# Patient Record
Sex: Male | Born: 1955 | Race: White | Hispanic: No | Marital: Married | Smoking: Current every day smoker
Health system: Southern US, Community
[De-identification: ages and names within clinical notes are randomized; demographics above are authoritative.]

## PROBLEM LIST (undated history)

## (undated) DIAGNOSIS — I10 Essential (primary) hypertension: Secondary | ICD-10-CM

## (undated) DIAGNOSIS — K219 Gastro-esophageal reflux disease without esophagitis: Secondary | ICD-10-CM

## (undated) DIAGNOSIS — M549 Dorsalgia, unspecified: Secondary | ICD-10-CM

## (undated) DIAGNOSIS — G8929 Other chronic pain: Secondary | ICD-10-CM

## (undated) HISTORY — PX: HERNIA REPAIR: SHX51

---

## 2006-12-24 ENCOUNTER — Other Ambulatory Visit: Payer: Self-pay

## 2006-12-24 ENCOUNTER — Inpatient Hospital Stay: Payer: Self-pay | Admitting: Internal Medicine

## 2008-06-08 IMAGING — CR CERVICAL SPINE - COMPLETE 4+ VIEW
1 series · 8 of 8 positions shown · non-contrast
Comparison: none

REASON FOR EXAM: left neck pain
COMMENTS:

[Series 1: view not recorded · 0.17mm/px · 8 of 8 slices shown]
[im 1/8]
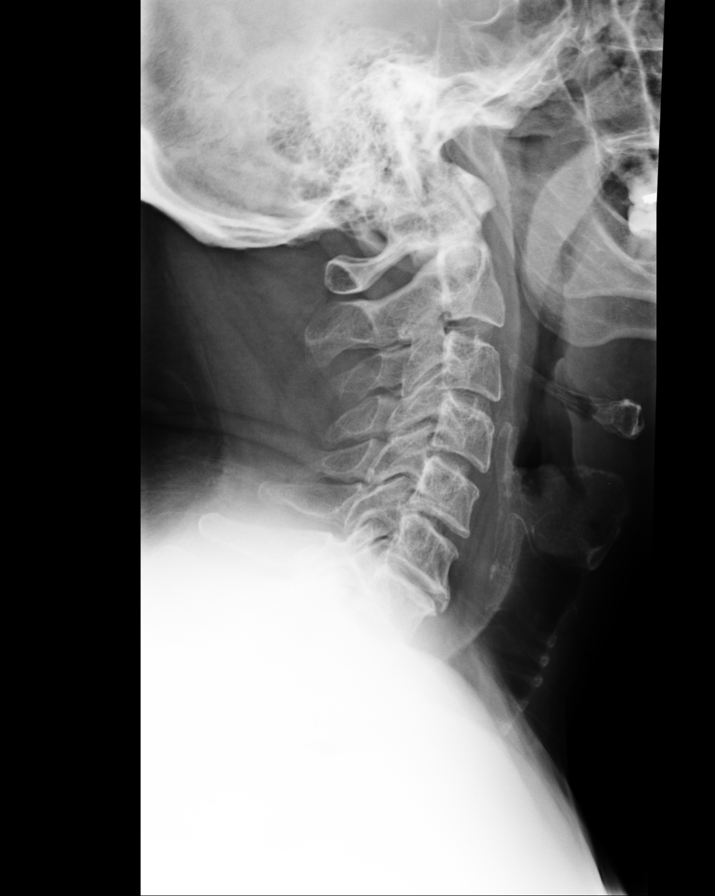
[im 2/8]
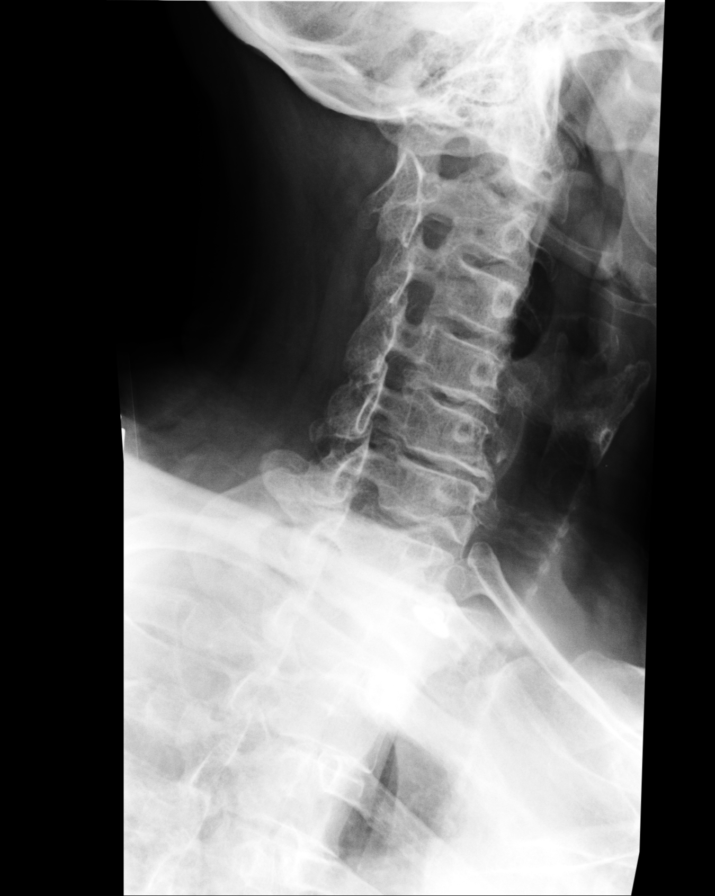
[im 3/8]
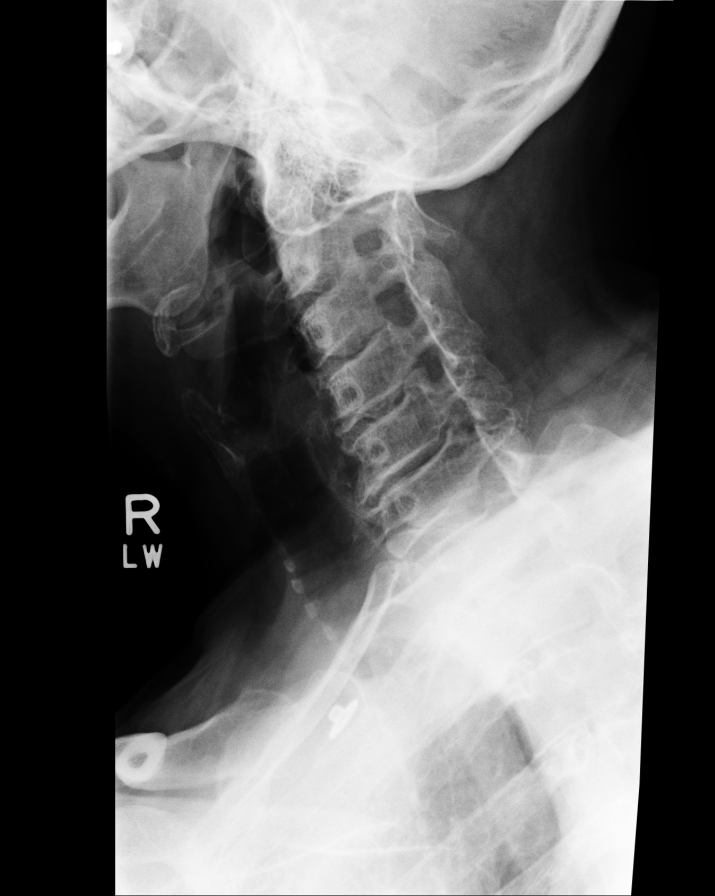
[im 4/8]
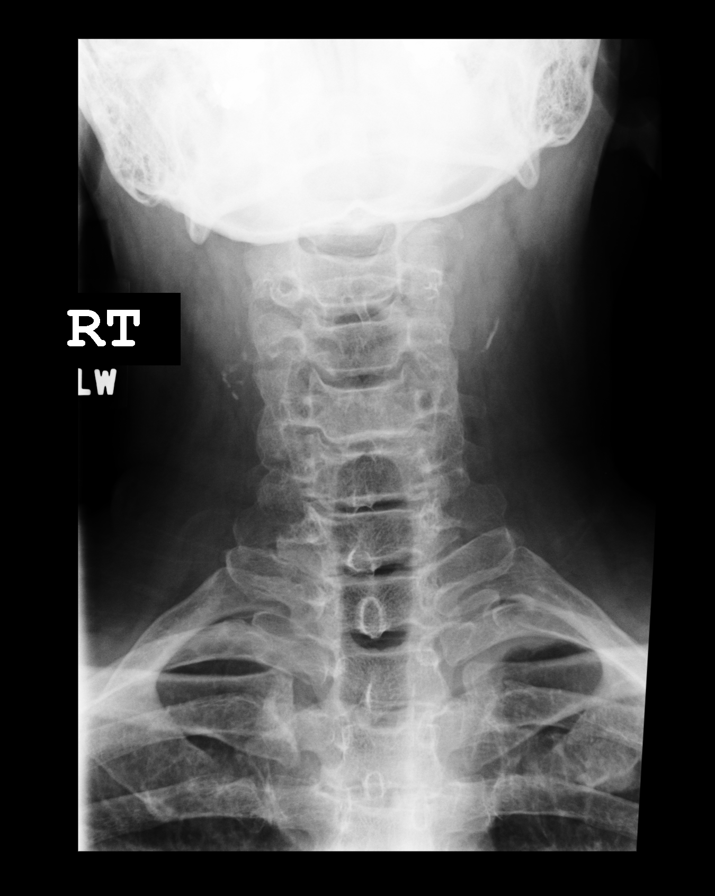
[im 5/8]
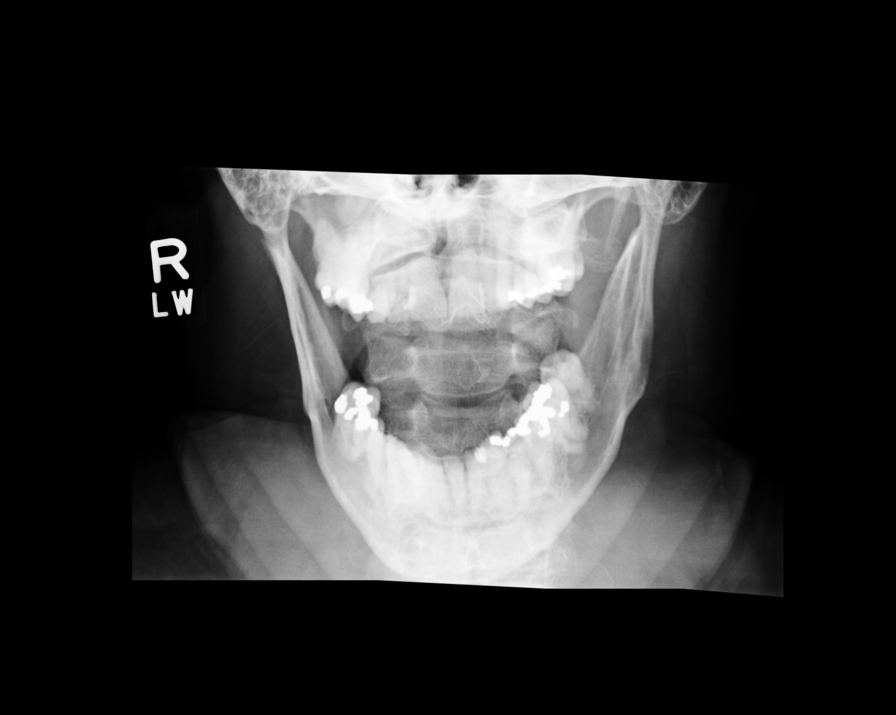
[im 6/8]
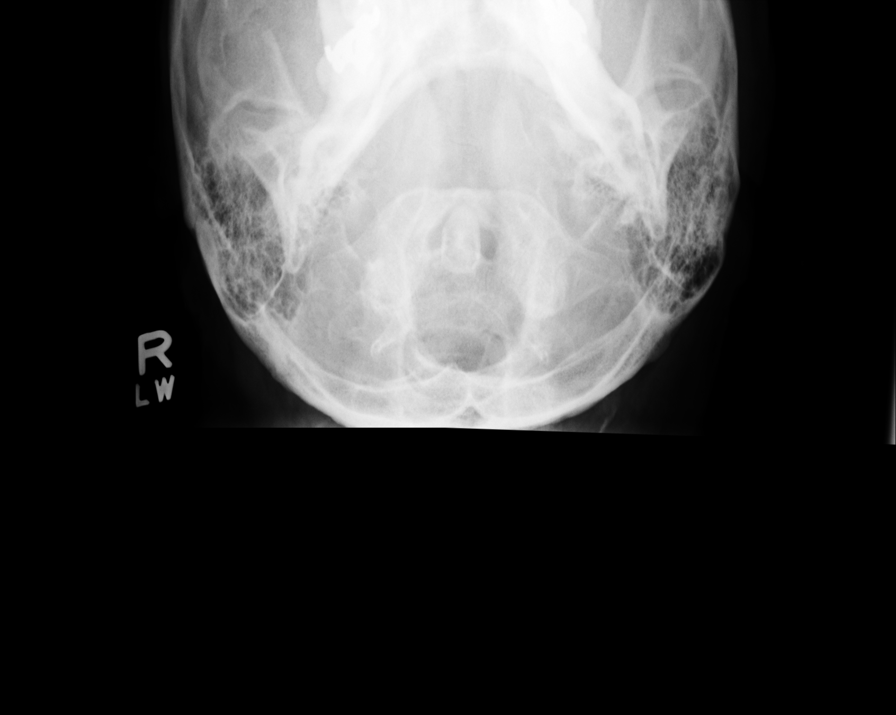
[im 7/8]
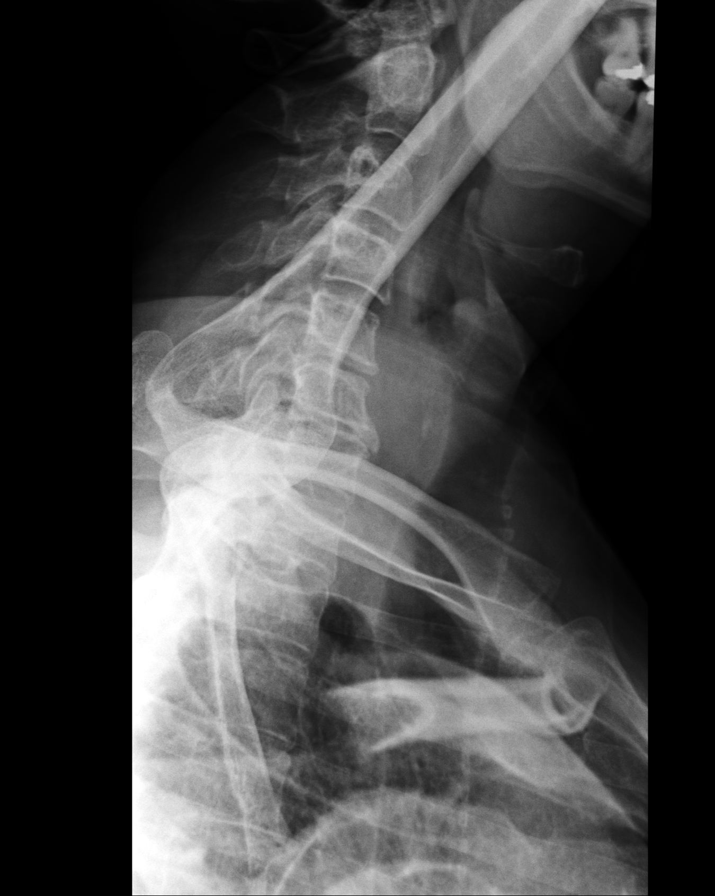
[im 8/8]
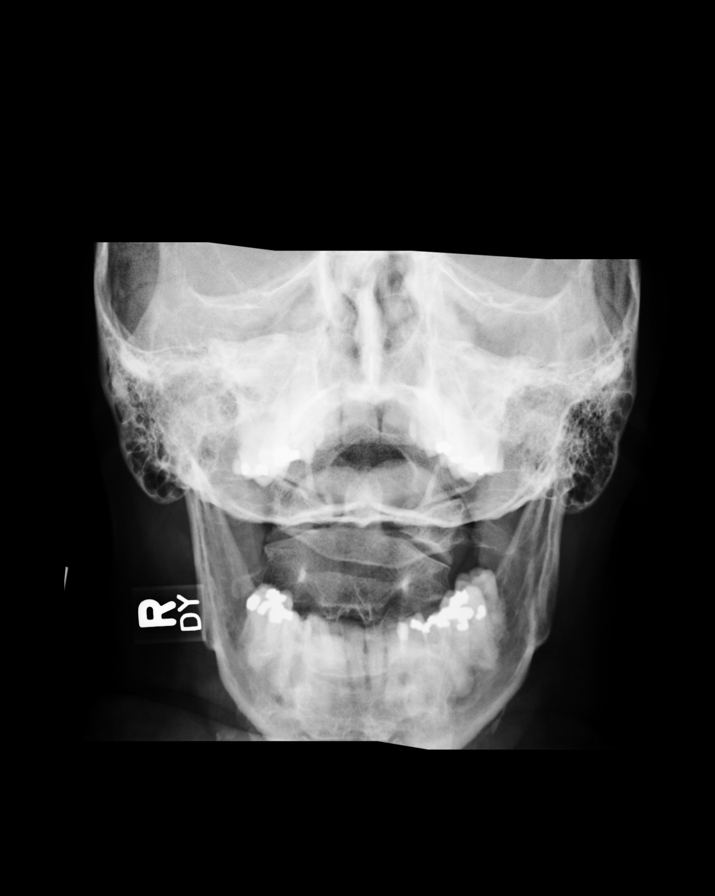

[8 of 8 positions shown; findings below may reference images not displayed]

PROCEDURE:     DXR - DXR CERVICAL SPINE COMPLETE  - December 24, 2006 [DATE]

RESULT:     Views of the cervical spine demonstrate normal alignment. There
is intervertebral disc space narrowing at multiple levels especially at
C6-C7 and C5-C6. C7-T1 is poorly seen on the lateral view. The odontoid is
not well seen. The lateral masses of C1 and C2 are poorly seen on the
open-mouth view which apparently was not repeated to good effect. The
prevertebral soft tissues appear to be normal. There is definite
atherosclerotic calcification in the carotid bifurcation regions bilaterally
worse on the RIGHT and LEFT. Correlation for bruits is recommended.
IMPRESSION: 1. Degenerative changes.
2. No acute bony abnormality evident.
3. Atherosclerotic calcification in the carotid bifurcations. Correlation
for bruit would be recommended. Doppler evaluation may be beneficial.

## 2009-01-08 ENCOUNTER — Ambulatory Visit: Payer: Self-pay

## 2010-06-24 IMAGING — CR PELVIS - 1-2 VIEW
1 series · 1 of 1 positions shown · non-contrast
Comparison: none

REASON FOR EXAM: bilateral hip pain, bone spurs
COMMENTS:

[view not recorded]
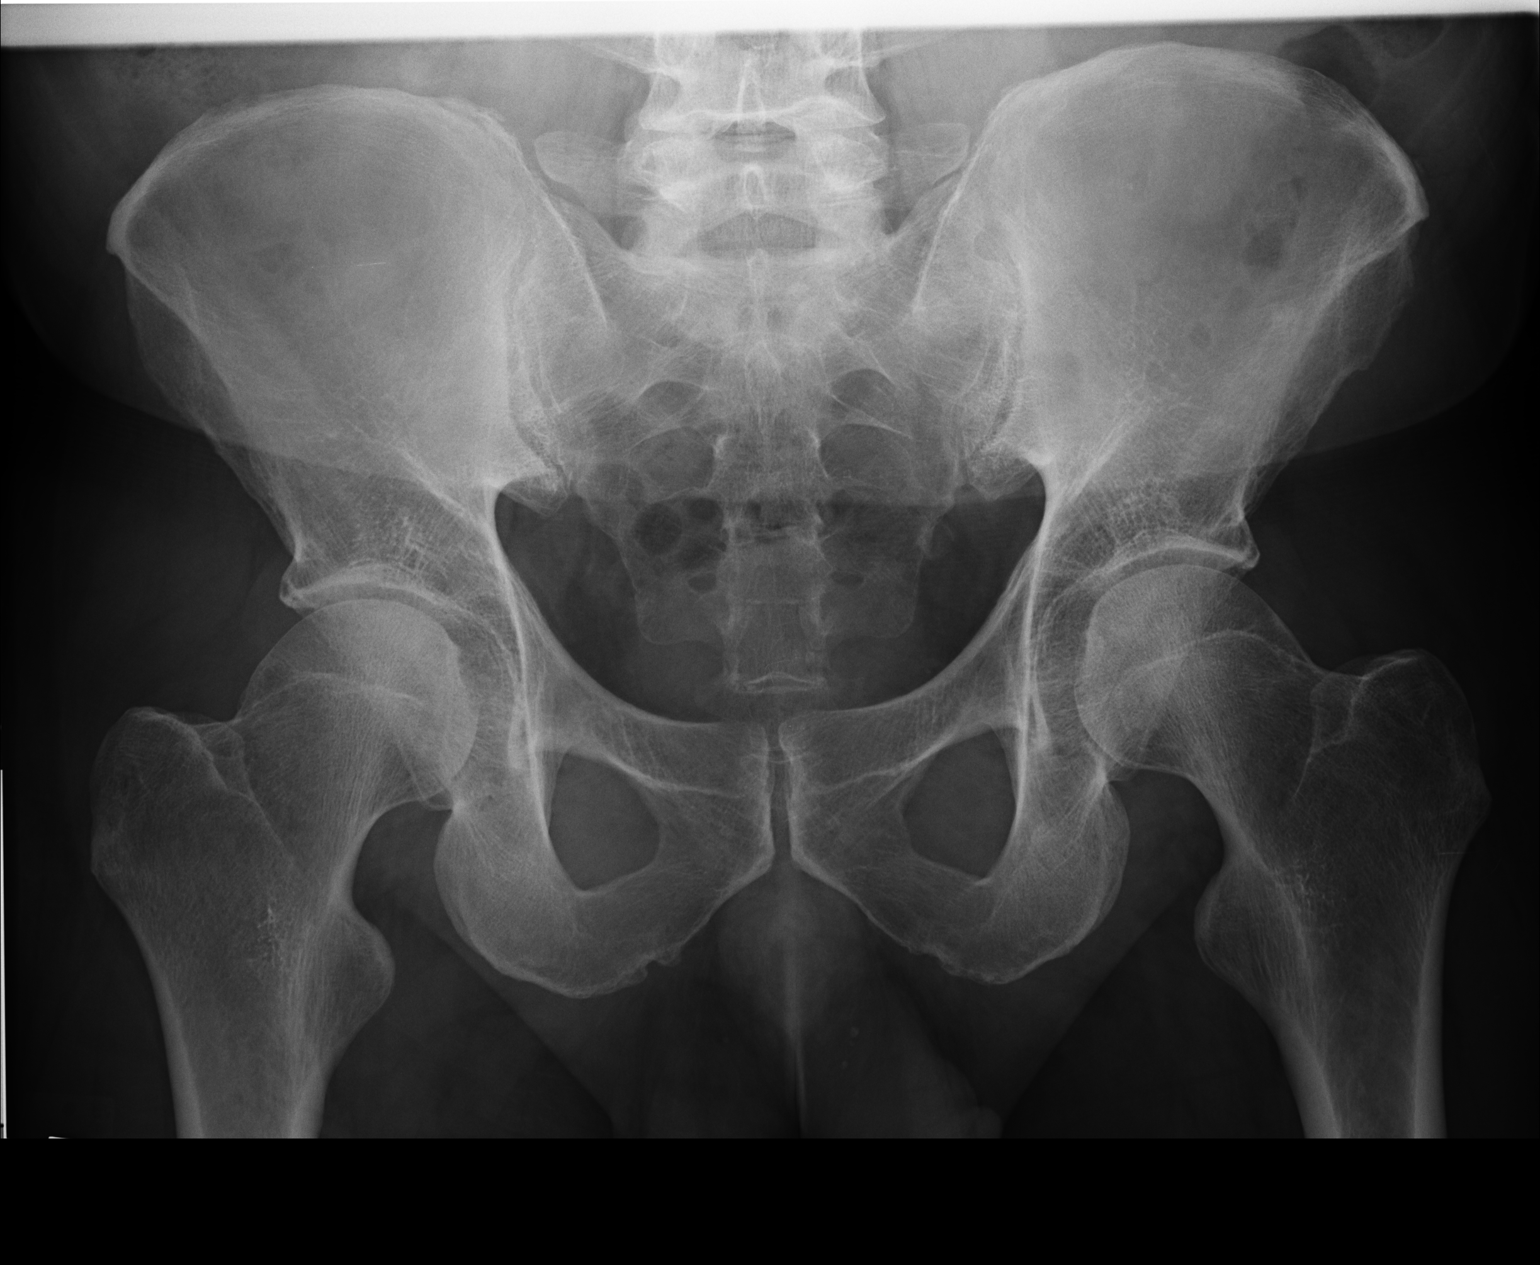

[1 of 1 positions shown; findings below may reference images not displayed]

PROCEDURE:     DXR - DXR PELVIS AP ONLY  - January 08, 2009  [DATE]

RESULT:     An AP view of the bony pelvis shows no fracture or dislocation.
The hip joint spaces are bilaterally symmetrical and are well-maintained. No
lytic or blastic lesions are seen. The sacroiliac joints are normal in
appearance.
IMPRESSION: No significant abnormalities are noted.

## 2014-02-11 ENCOUNTER — Encounter (HOSPITAL_BASED_OUTPATIENT_CLINIC_OR_DEPARTMENT_OTHER): Payer: Self-pay | Admitting: Emergency Medicine

## 2014-02-11 ENCOUNTER — Emergency Department (HOSPITAL_BASED_OUTPATIENT_CLINIC_OR_DEPARTMENT_OTHER)
Admission: EM | Admit: 2014-02-11 | Discharge: 2014-02-11 | Disposition: A | Discharge status: Home / Self Care | Payer: Medicaid Other | Attending: Emergency Medicine | Admitting: Emergency Medicine

## 2014-02-11 ENCOUNTER — Emergency Department (HOSPITAL_BASED_OUTPATIENT_CLINIC_OR_DEPARTMENT_OTHER): Payer: Medicaid Other

## 2014-02-11 DIAGNOSIS — X58XXXA Exposure to other specified factors, initial encounter: Secondary | ICD-10-CM | POA: Insufficient documentation

## 2014-02-11 DIAGNOSIS — Z79899 Other long term (current) drug therapy: Secondary | ICD-10-CM | POA: Diagnosis not present

## 2014-02-11 DIAGNOSIS — S6980XA Other specified injuries of unspecified wrist, hand and finger(s), initial encounter: Secondary | ICD-10-CM | POA: Diagnosis present

## 2014-02-11 DIAGNOSIS — S60229A Contusion of unspecified hand, initial encounter: Secondary | ICD-10-CM | POA: Diagnosis not present

## 2014-02-11 DIAGNOSIS — F17200 Nicotine dependence, unspecified, uncomplicated: Secondary | ICD-10-CM | POA: Diagnosis not present

## 2014-02-11 DIAGNOSIS — K219 Gastro-esophageal reflux disease without esophagitis: Secondary | ICD-10-CM | POA: Insufficient documentation

## 2014-02-11 DIAGNOSIS — S6990XA Unspecified injury of unspecified wrist, hand and finger(s), initial encounter: Secondary | ICD-10-CM | POA: Diagnosis present

## 2014-02-11 DIAGNOSIS — Y929 Unspecified place or not applicable: Secondary | ICD-10-CM | POA: Diagnosis not present

## 2014-02-11 DIAGNOSIS — Z880 Allergy status to penicillin: Secondary | ICD-10-CM | POA: Diagnosis not present

## 2014-02-11 DIAGNOSIS — G8929 Other chronic pain: Secondary | ICD-10-CM | POA: Diagnosis not present

## 2014-02-11 DIAGNOSIS — Y9389 Activity, other specified: Secondary | ICD-10-CM | POA: Insufficient documentation

## 2014-02-11 DIAGNOSIS — I10 Essential (primary) hypertension: Secondary | ICD-10-CM | POA: Diagnosis not present

## 2014-02-11 DIAGNOSIS — S60222A Contusion of left hand, initial encounter: Secondary | ICD-10-CM

## 2014-02-11 HISTORY — DX: Dorsalgia, unspecified: M54.9

## 2014-02-11 HISTORY — DX: Essential (primary) hypertension: I10

## 2014-02-11 HISTORY — DX: Other chronic pain: G89.29

## 2014-02-11 HISTORY — DX: Gastro-esophageal reflux disease without esophagitis: K21.9

## 2014-02-11 MED ORDER — HYDROCODONE-ACETAMINOPHEN 5-325 MG PO TABS: 2.0000 | ORAL_TABLET | ORAL | Status: AC | PRN

## 2014-02-11 NOTE — ED Provider Notes (Signed)
CSN: [id number]     Arrival date & time 02/11/14  1423 History   First MD Initiated Contact with Patient 02/11/14 1504     Chief Complaint  Patient presents with  . Finger Injury     (Consider location/radiation/quality/duration/timing/severity/associated sxs/prior Treatment) Patient is a 58 y.o. male presenting with hand injury. The history is provided by the patient. No language interpreter was used.  Hand Injury Location:  Hand Time since incident:  1 day Injury: no   Hand location:  R hand Pain details:    Quality:  Aching   Radiates to:  Does not radiate   Severity:  Moderate   Onset quality:  Gradual   Duration:  1 day   Timing:  Constant   Progression:  Worsening Chronicity:  New Handedness:  Right-handed Dislocation: no   Prior injury to area:  No Relieved by:  Nothing Ineffective treatments:  None tried Risk factors: no known bone disorder     Past Medical History  Diagnosis Date  . Chronic back pain   . Hypertension   . GERD (gastroesophageal reflux disease)    Past Surgical History  Procedure Laterality Date  . Hernia repair     History reviewed. No pertinent family history. History  Substance Use Topics  . Smoking status: Current Every Day Smoker -- 0.50 packs/day    Types: Cigarettes  . Smokeless tobacco: Not on file  . Alcohol Use: No    Review of Systems  Musculoskeletal: Positive for joint swelling and myalgias.  All other systems reviewed and are negative.     Allergies  Penicillins  Home Medications   Prior to Admission medications   Medication Sig Start Date End Date Taking? Authorizing Provider  cyclobenzaprine (FLEXERIL) 10 MG tablet Take 10 mg by mouth 3 (three) times daily as needed for muscle spasms.   Yes Historical Provider, MD  HYDROcodone-acetaminophen (NORCO/VICODIN) 5-325 MG per tablet Take 1 tablet by mouth every 6 (six) hours as needed for moderate pain.   Yes Historical Provider, MD  metoprolol tartrate (LOPRESSOR)  25 MG tablet Take 25 mg by mouth 2 (two) times daily.   Yes Historical Provider, MD  ranitidine (ZANTAC) 150 MG tablet Take 150 mg by mouth 2 (two) times daily.   Yes Historical Provider, MD   BP 137/97  Pulse 78  Temp(Src) 98.4 F (36.9 C) (Oral)  Resp 16  Ht 6' (1.829 m)  Wt 220 lb (99.791 kg)  BMI 29.83 kg/m2  SpO2 99% Physical Exam  Nursing note and vitals reviewed. Constitutional: He appears well-developed and well-nourished.  HENT:  Head: Normocephalic.  Musculoskeletal: Normal range of motion. He exhibits tenderness.  Swollen thenar prominence,   From,  nv and ns intact  Neurological: He is alert.  Skin: Skin is warm.  Psychiatric: He has a normal mood and affect.    ED Course  Procedures (including critical care time) Labs Review Labs Reviewed - No data to display  Imaging Review Dg Finger Thumb Left  02/11/2014   CLINICAL DATA:  Left thumb pain.  Injury.  EXAM: LEFT THUMB 2+V  COMPARISON:  None.  FINDINGS: A small soft tissue calcification is noted lateral to the trapezium. Bones appear mildly osteopenic. No acute fracture or dislocation is identified. No soft tissue abnormality or radiopaque foreign body is seen.  IMPRESSION: No acute osseous abnormality identified.   Electronically Signed   By: Sebastian AcheAllen  Grady   On: 02/11/2014 14:44     EKG Interpretation None  MDM  No fracture,  Ace wrap.   Rx for hydrocodone   Final diagnoses:  Contusion, hand, left, initial encounter        Elson Areas, PA-C 02/11/14 1525  Lonia Skinner [place], [place] 02/11/14 1525

## 2014-02-11 NOTE — Discharge Instructions (Signed)
Hand Contusion °A hand contusion is a deep bruise on your hand area. Contusions are the result of an injury that caused bleeding under the skin. The contusion may turn blue, purple, or yellow. Minor injuries will give you a painless contusion, but more severe contusions may stay painful and swollen for a few weeks. °CAUSES  °A contusion is usually caused by a blow, trauma, or direct force to an area of the body. °SYMPTOMS  °· Swelling and redness of the injured area. °· Discoloration of the injured area. °· Tenderness and soreness of the injured area. °· Pain. °DIAGNOSIS  °The diagnosis can be made by taking a history and performing a physical exam. An X-ray, CT scan, or MRI may be needed to determine if there were any associated injuries, such as broken bones (fractures). °TREATMENT  °Often, the best treatment for a hand contusion is resting, elevating, icing, and applying cold compresses to the injured area. Over-the-counter medicines may also be recommended for pain control. °HOME CARE INSTRUCTIONS  °· Put ice on the injured area. °¨ Put ice in a plastic bag. °¨ Place a towel between your skin and the bag. °¨ Leave the ice on for 15-20 minutes, 03-04 times a day. °· Only take over-the-counter or prescription medicines as directed by your caregiver. Your caregiver may recommend avoiding anti-inflammatory medicines (aspirin, ibuprofen, and naproxen) for 48 hours because these medicines may increase bruising. °· If told, use an elastic wrap as directed. This can help reduce swelling. You may remove the wrap for sleeping, showering, and bathing. If your fingers become numb, cold, or blue, take the wrap off and reapply it more loosely. °· Elevate your hand with pillows to reduce swelling. °· Avoid overusing your hand if it is painful. °SEEK IMMEDIATE MEDICAL CARE IF:  °· You have increased redness, swelling, or pain in your hand. °· Your swelling or pain is not relieved with medicines. °· You have loss of feeling in  your hand or are unable to move your fingers. °· Your hand turns cold or blue. °· You have pain when you move your fingers. °· Your hand becomes warm to the touch. °· Your contusion does not improve in 2 days. °MAKE SURE YOU:  °· Understand these instructions. °· Will watch your condition. °· Will get help right away if you are not doing well or get worse. °Document Released: 01/06/2002 Document Revised: 04/10/2012 Document Reviewed: 01/08/2012 °[employer]® Patient Information ©2015 [employer]. This information is not intended to replace advice given to you by your health care provider. Make sure you discuss any questions you have with your health care provider. ° °

## 2014-02-11 NOTE — ED Provider Notes (Signed)
Medical screening examination/treatment/procedure(s) were performed by non-physician practitioner and as supervising physician I was immediately available for consultation/collaboration.   EKG Interpretation None        Dagmar HaitWilliam Kimiah Hibner, MD 02/11/14 2351

## 2014-02-11 NOTE — ED Notes (Signed)
Pt c/o left thumb injury x 1 day ago

## 2015-07-28 IMAGING — CR DG FINGER THUMB 2+V*L*
3 series · 3 of 3 positions shown · non-contrast
Comparison: None.

CLINICAL DATA: Left thumb pain.  Injury.

EXAM:
LEFT THUMB 2+V

[x finger pa left]
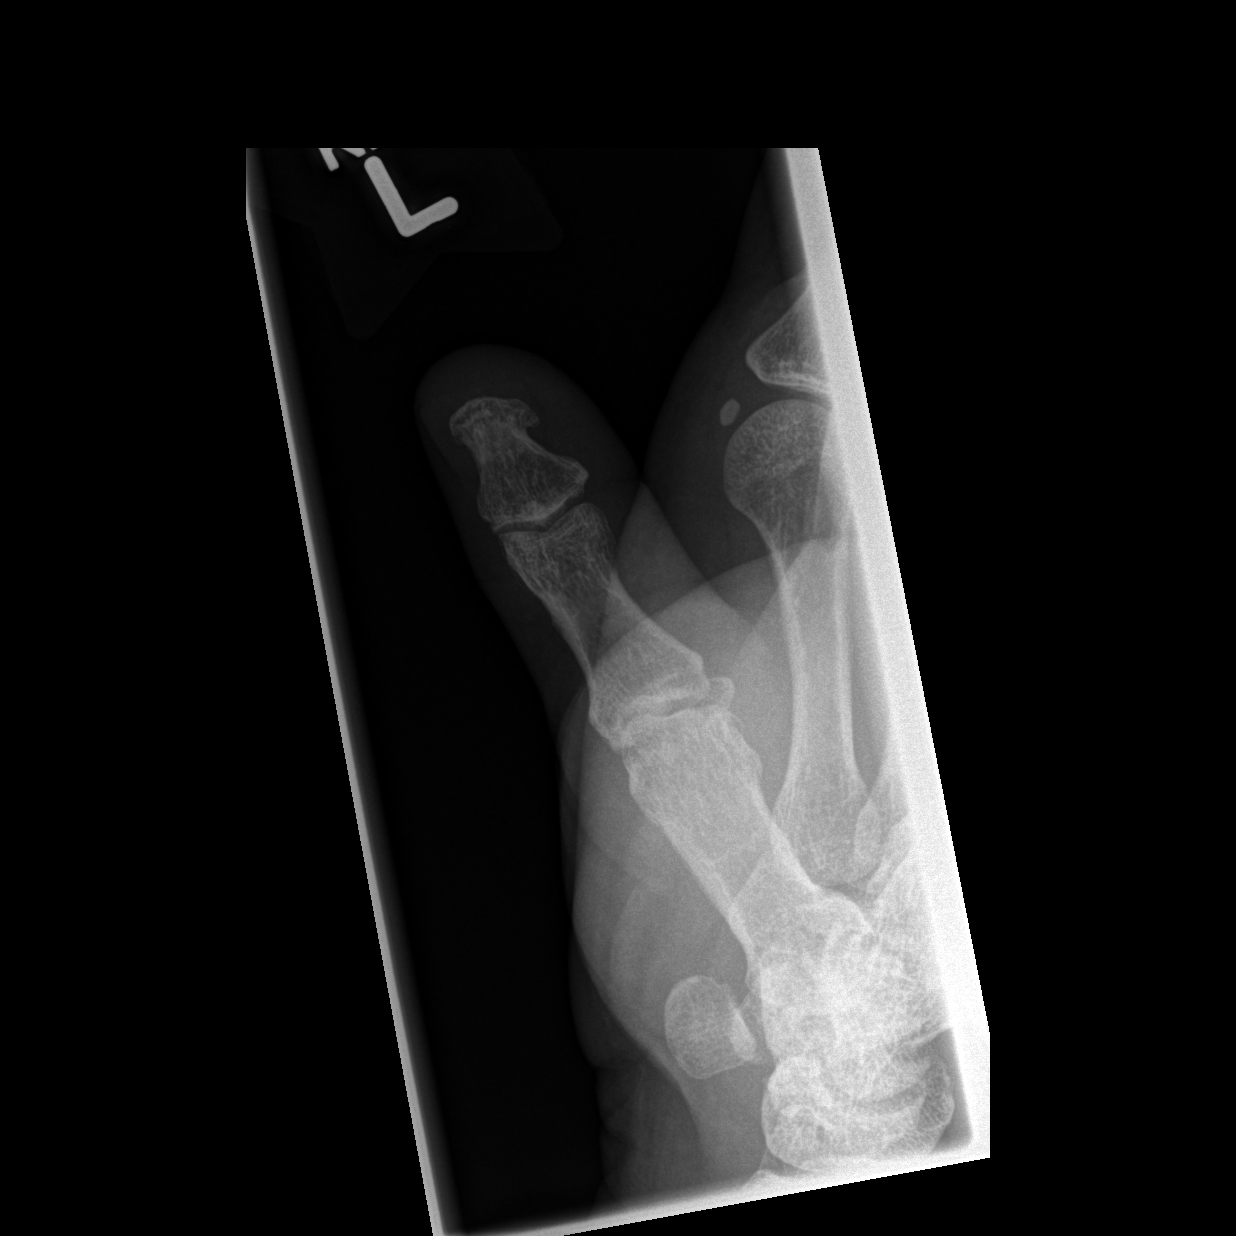

[x finger obl. left]
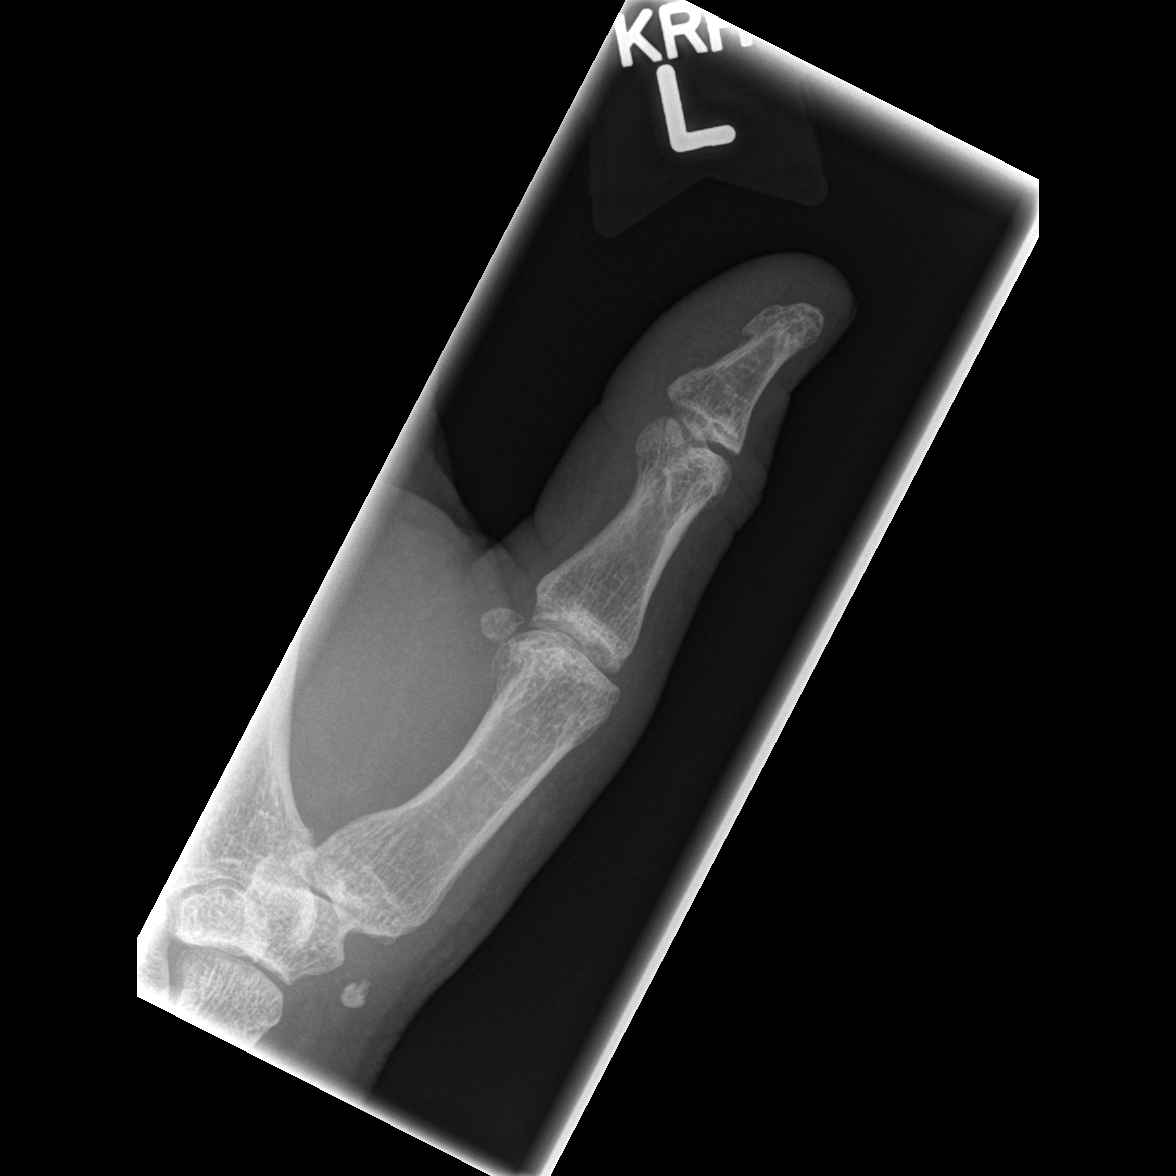

[x finger lateral left]
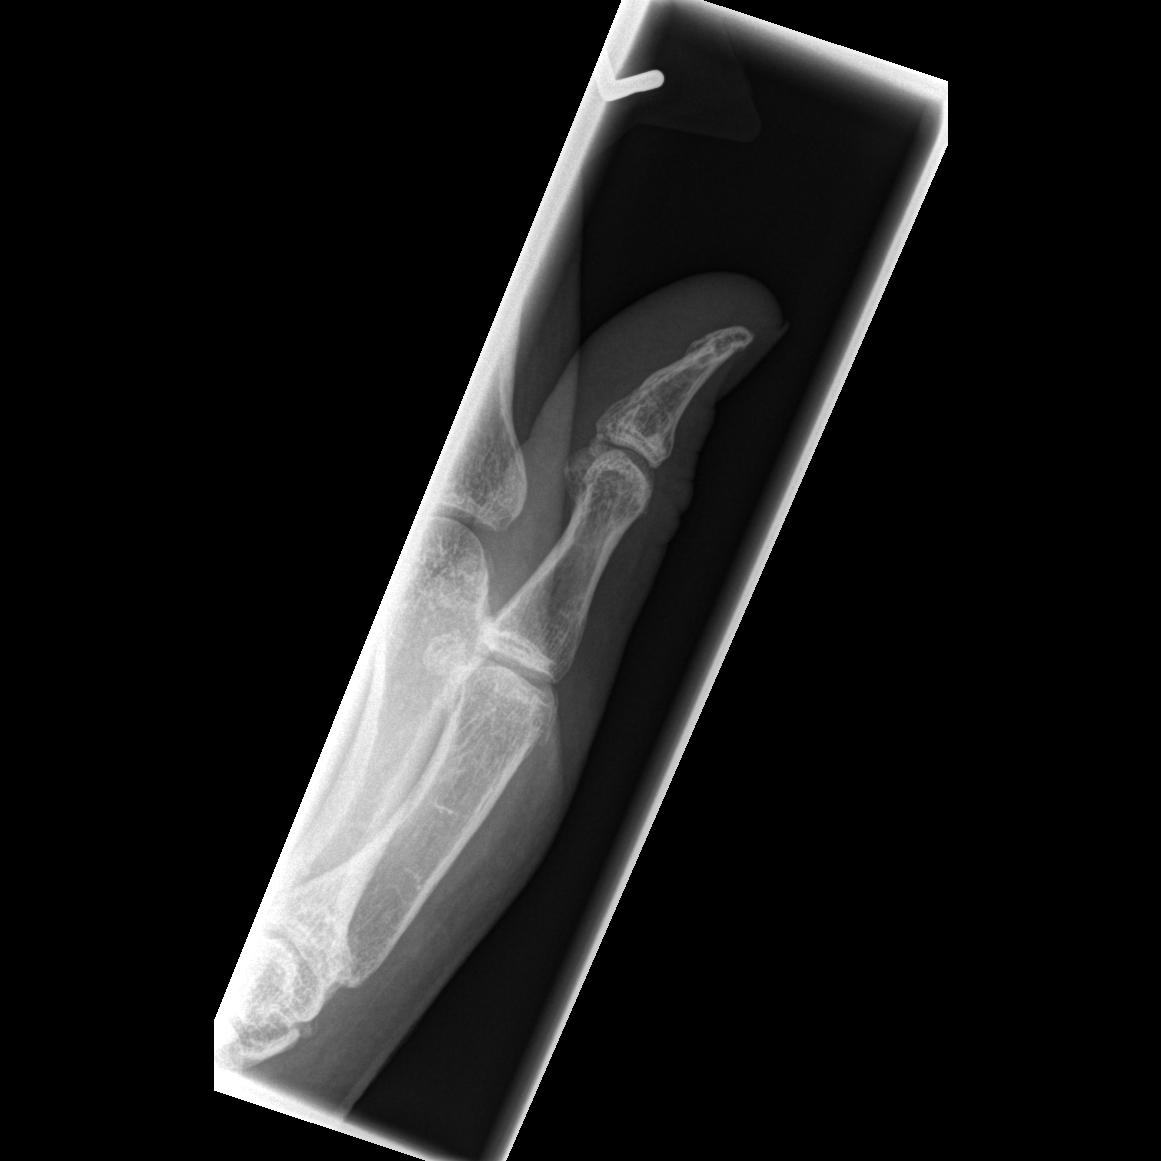

[3 of 3 positions shown; findings below may reference images not displayed]

FINDINGS: A small soft tissue calcification is noted lateral to the trapezium.
Bones appear mildly osteopenic. No acute fracture or dislocation is
identified. No soft tissue abnormality or radiopaque foreign body is
seen.
IMPRESSION: No acute osseous abnormality identified.

## 2017-08-31 DEATH — deceased
